# Patient Record
Sex: Male | Born: 1995
Health system: Southern US, Community
[De-identification: ages and names within clinical notes are randomized; demographics above are authoritative.]

---
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
## 2016-02-26 ENCOUNTER — Ambulatory Visit (INDEPENDENT_AMBULATORY_CARE_PROVIDER_SITE_OTHER): Payer: BLUE CROSS/BLUE SHIELD | Admitting: Family Medicine

## 2016-02-26 ENCOUNTER — Encounter: Payer: Self-pay | Admitting: Family Medicine

## 2016-02-26 VITALS — BP 123/64 | HR 56 | Temp 96.8°F

## 2016-02-26 DIAGNOSIS — L089 Local infection of the skin and subcutaneous tissue, unspecified: Secondary | ICD-10-CM

## 2016-02-26 MED ORDER — SULFAMETHOXAZOLE-TRIMETHOPRIM 800-160 MG PO TABS: 1.0000 | ORAL_TABLET | Freq: Two times a day (BID) | ORAL | 0 refills | Status: DC

## 2016-02-26 NOTE — Progress Notes (Signed)
Patient presents today with symptoms of slightly itchy rash to upper extremities and face. Patient states that initially the rash on his left forearm started by a scratch/scrape playing football. It has now started to drain some. He then noticed another lesion pop up on his right upper extremity and a few pimples on his face and beard area. He admits to having staph but not MRSA in high school. He denies any fever or chills or any other rash. He denies being in the whirlpool in the training room.  ROS: Negative except mentioned above. Vitals as per Epic.  GENERAL: NAD HEENT: no pharyngeal erythema, no exudate RESP: CTA B CARD: RRR SKIN: two slightly scabbed abrasions on the left forearm area, smaller area noted on the right forearm, no significant drainage noted of the areas, no streaks, a few pimples noted on the left side of his beard and on his forehead, no drainage noted of the sites NEURO: CN II-XII grossly intact   A/P: Skin infection -discussed with patient that I would recommend that he use a antibacterial soap to wash daily his face and body, pat dry, avoid shaving over the pimples on his face, should change out razor regularly, cover areas if draining, no weight room activities if the areas are draining, discussed not going in the whirlpool until areas are all healed, follow up with me at the end of the week if symptoms are worsening. Will discuss with trainer.

## 2017-07-27 ENCOUNTER — Encounter: Payer: Self-pay | Admitting: Family Medicine

## 2017-07-27 ENCOUNTER — Ambulatory Visit (INDEPENDENT_AMBULATORY_CARE_PROVIDER_SITE_OTHER): Payer: BLUE CROSS/BLUE SHIELD | Admitting: Family Medicine

## 2017-07-27 ENCOUNTER — Ambulatory Visit
Admission: RE | Admit: 2017-07-27 | Discharge: 2017-07-27 | Disposition: A | Discharge status: Home / Self Care | Payer: BLUE CROSS/BLUE SHIELD | Source: Ambulatory Visit | Attending: Family Medicine | Admitting: Family Medicine

## 2017-07-27 VITALS — BP 122/68 | HR 69 | Temp 97.2°F

## 2017-07-27 DIAGNOSIS — R05 Cough: Secondary | ICD-10-CM

## 2017-07-27 DIAGNOSIS — J45901 Unspecified asthma with (acute) exacerbation: Secondary | ICD-10-CM

## 2017-07-27 DIAGNOSIS — R059 Cough, unspecified: Secondary | ICD-10-CM

## 2017-07-27 LAB — POCT INFLUENZA A/B
INFLUENZA B, POC: NEGATIVE
Influenza A, POC: NEGATIVE

## 2017-07-27 MED ORDER — ALBUTEROL SULFATE HFA 108 (90 BASE) MCG/ACT IN AERS: 2.0000 | INHALATION_SPRAY | Freq: Four times a day (QID) | RESPIRATORY_TRACT | 2 refills | Status: AC | PRN

## 2017-07-27 MED ORDER — AZITHROMYCIN 250 MG PO TABS: ORAL_TABLET | ORAL | 0 refills | Status: DC

## 2017-07-27 NOTE — Progress Notes (Signed)
Patient presents today with symptoms of nasal congestion, night sweats, cough. He denies any documented fever. Patient states that he has had the symptoms for the last 4-5 days. He denies any body aches, headache, nausea, vomiting, diarrhea, abdominal pain. Patient admits to having a history of asthma as a child. He has been noticing more wheezing as he does physical activity or talks. He admits to yellow colored mucus. He denies any chest pain. He denies taking any medication today.  ROS: Negative except mentioned above. Vitals as per Epic. GENERAL: NAD HEENT: no pharyngeal erythema, no exudate, no erythema of TMs, no cervical LAD RESP: Bilateral expiratory wheezing, no accessory muscle use noted CARD: RRR NEURO: CN II-XII grossly intact   Rapid strep test: negative Influenza screen: negative  A/P: Bronchitis - will do chest x-ray to make sure patient doesn't have pneumonia, Z-Pak prescribed, Albuterol Inhaler when necessary, Delsym when necessary, Tylenol/Ibuprofen when necessary, rest, hydration, no athletic activity today, should not go to class or do athletic activity if febrile, if breathing symptoms worsen should seek immediate medical attention, trainer will reassess status in the morning to see if patient should do any athletic activity based off of above.

## 2017-11-16 ENCOUNTER — Ambulatory Visit (INDEPENDENT_AMBULATORY_CARE_PROVIDER_SITE_OTHER): Payer: BLUE CROSS/BLUE SHIELD | Admitting: Family Medicine

## 2017-11-16 ENCOUNTER — Encounter: Payer: Self-pay | Admitting: Family Medicine

## 2017-11-16 DIAGNOSIS — M25519 Pain in unspecified shoulder: Secondary | ICD-10-CM

## 2017-11-16 NOTE — Progress Notes (Signed)
Patient presents today with symptoms of right shoulder pain. He states that he was playing Ultimate Frisbee with his teammates when he landed on his right shoulder. He denies any feeling of subluxation. He states that the athletic trainer believes he has an AC sprain. He states that his motion was more limited a few days ago but has improved. He denies any history of any shoulder problems in the past. He has been wearing a sling intermittently for comfort. He has also been taking ibuprofen on occasion.  ROS: Negative except mentioned above. Vitals as per Epic. GENERAL: NAD MSK: R Shoulder : no ecchymosis or significant swelling appreciated in the right shoulder area, there is mild tenderness in the mid clavicle area and AC Joint, there is some mild tenderness in the lateral pectoralis muscle area, no obvious defect noted in this area or ecchymosis, mild pain with cross arm testing and forward flexion above 100 degrees, negative impingement tests, negative apprehension, mild discomfort with lift off, negative empty can, NV intact NEURO: CN II-XII grossly intact   A/P: Right Shoulder Injury - discussed likelihood of AC sprain, will get x-rays, sling for comfort, NSAIDs when necessary, ice area, no athletic activity with the right upper extremity, can do lower extremity work if tolerated, will discuss plan above with trainer, follow-up with Dr. Ardine Engiehl the next he is on campus. Seek medical attention if any worsening of symptoms.

## 2017-11-20 ENCOUNTER — Ambulatory Visit
Admission: RE | Admit: 2017-11-20 | Discharge: 2017-11-20 | Disposition: A | Discharge status: Home / Self Care | Payer: BLUE CROSS/BLUE SHIELD | Source: Ambulatory Visit | Attending: Family Medicine | Admitting: Family Medicine

## 2017-11-20 DIAGNOSIS — S4991XA Unspecified injury of right shoulder and upper arm, initial encounter: Secondary | ICD-10-CM | POA: Diagnosis not present

## 2017-11-20 DIAGNOSIS — M25511 Pain in right shoulder: Secondary | ICD-10-CM | POA: Insufficient documentation

## 2017-11-20 DIAGNOSIS — M25519 Pain in unspecified shoulder: Secondary | ICD-10-CM

## 2017-11-30 ENCOUNTER — Encounter: Payer: Self-pay | Admitting: Family Medicine

## 2017-11-30 ENCOUNTER — Ambulatory Visit (INDEPENDENT_AMBULATORY_CARE_PROVIDER_SITE_OTHER): Payer: BLUE CROSS/BLUE SHIELD | Admitting: Family Medicine

## 2017-11-30 VITALS — Temp 98.6°F

## 2017-11-30 DIAGNOSIS — L0202 Furuncle of face: Secondary | ICD-10-CM | POA: Diagnosis not present

## 2017-11-30 MED ORDER — CHLORHEXIDINE GLUCONATE 4 % EX LIQD: Freq: Every day | CUTANEOUS | 0 refills | Status: AC | PRN

## 2017-11-30 MED ORDER — SULFAMETHOXAZOLE-TRIMETHOPRIM 800-160 MG PO TABS: 1.0000 | ORAL_TABLET | Freq: Two times a day (BID) | ORAL | 0 refills | Status: DC

## 2017-11-30 NOTE — Progress Notes (Signed)
Patient presents today with history of draining lesion on right side of face. Patient states that initially he noticed a bump along his beard line on the right side a few days ago. He tried to pop the area with his fingers initially and then with a needle. The area has been draining and is less swollen since doing this. It is however tender. He denies any fever or chills. He states that occasionally he gets these on his face and scalp. Patient denies shaving his facial hair.   ROS: Negative except mentioned above. Vitals as per Epic. GENERAL: NAD SKIN: quarter sized mildly tender, indurated area along the right jaw line where patient's beard is, some white discharge expressed, no other lesions noted on the face or scalp, the area is not extending to the neck or towards the ear or nose NEURO: CN II-XII grossly intact   A/P: Furuncle Face - warm compresses to the area, culture taken of the area, recommend no athletic activity for now over the next 2 days as the area is draining, will reevaluate patient on Wednesday or Thursday, Bactrim DS for 10 days, Hibiclens Solution to wash the area, Tylenol/Motrin when necessary for pain. Discussed above with trainer as well. If the area persists or worsens, will consider referral to Dermatology.

## 2017-12-01 ENCOUNTER — Ambulatory Visit (INDEPENDENT_AMBULATORY_CARE_PROVIDER_SITE_OTHER): Payer: BLUE CROSS/BLUE SHIELD | Admitting: Family Medicine

## 2017-12-01 VITALS — Temp 97.7°F

## 2017-12-01 DIAGNOSIS — L0202 Furuncle of face: Secondary | ICD-10-CM

## 2017-12-01 DIAGNOSIS — L089 Local infection of the skin and subcutaneous tissue, unspecified: Secondary | ICD-10-CM

## 2017-12-01 NOTE — Progress Notes (Signed)
Patient presents today with symptoms of of right upper eyelid swelling. Patient states that he notices this morning. Patient was treated for an abscess on his face yesterday with Bactrim DS. The patient states that the area is less tender and is still draining some. He denies any tenderness or swelling of his right cheek. He denies any discharge from the eyes. He denies any fever, chills, or headache. He did not take any Tylenol or Ibuprofen today. He has taken only 2 doses of his Bactrim DS. Results for wound culture are not back.  ROS: Negative except mentioned above. Vitals as per Epic. GENERAL: NAD HEENT: no erythema of the conjunctiva, no discharge from the eye, minimal swelling of the right upper eyelid, PERRL, EOMI SKIN: there is still a quarter sized indurated area along the right jaw line, minimal fluid is expressed, the area is less tender than yesterday, no other swelling noted around the orbit or other parts of the face besides right upper eyelid, no rash anywhere else on body  NEURO: CN II-XII grossly intact   A/P: Right Facial Abscess - there is minimal swelling of the right upper eyelid,  Consulted with Dr. Pilar Plateavid Dasher (Dermatology) who suggested increasing his Bactrim DS to 2 tabs twice daily. Dr. Adolphus Birchwoodasher will see the patient tomorrow. We will await wound culture results. Continue warm compresses on the area. Patient given gloves and bandages. Encourage patient to wash hands frequently. No athletic activity for now. If any acute worsening symptoms patient is to seek immediate medical attention. If symptoms do not improve will have to consider IV/IM antibiotics, incision and drainage of the area, etc. after seeing and consulting with Dermatology. Discussed above with trainer.

## 2017-12-02 ENCOUNTER — Other Ambulatory Visit: Payer: Self-pay | Admitting: Family Medicine

## 2017-12-02 DIAGNOSIS — L0201 Cutaneous abscess of face: Secondary | ICD-10-CM | POA: Diagnosis not present

## 2017-12-02 MED ORDER — CEPHALEXIN 500 MG PO CAPS: 500.0000 mg | ORAL_CAPSULE | Freq: Four times a day (QID) | ORAL | 0 refills | Status: DC

## 2017-12-03 LAB — WOUND CULTURE: Organism ID, Bacteria: NONE SEEN

## 2018-01-21 ENCOUNTER — Ambulatory Visit
Admission: RE | Admit: 2018-01-21 | Discharge: 2018-01-21 | Disposition: A | Discharge status: Home / Self Care | Payer: BLUE CROSS/BLUE SHIELD | Source: Ambulatory Visit | Attending: Family Medicine | Admitting: Family Medicine

## 2018-01-21 ENCOUNTER — Encounter: Payer: Self-pay | Admitting: Family Medicine

## 2018-01-21 ENCOUNTER — Ambulatory Visit (INDEPENDENT_AMBULATORY_CARE_PROVIDER_SITE_OTHER): Payer: BLUE CROSS/BLUE SHIELD | Admitting: Family Medicine

## 2018-01-21 VITALS — BP 116/68 | HR 69 | Temp 98.1°F | Resp 14

## 2018-01-21 DIAGNOSIS — R062 Wheezing: Secondary | ICD-10-CM

## 2018-01-21 DIAGNOSIS — R0602 Shortness of breath: Secondary | ICD-10-CM | POA: Diagnosis not present

## 2018-01-21 MED ORDER — FLUTICASONE PROPIONATE HFA 110 MCG/ACT IN AERO: 2.0000 | INHALATION_SPRAY | Freq: Two times a day (BID) | RESPIRATORY_TRACT | 12 refills | Status: AC

## 2018-01-21 MED ORDER — PREDNISONE 20 MG PO TABS: ORAL_TABLET | ORAL | 0 refills | Status: DC

## 2018-01-21 MED ORDER — AZITHROMYCIN 250 MG PO TABS: ORAL_TABLET | ORAL | 0 refills | Status: DC

## 2018-02-05 NOTE — Progress Notes (Signed)
Patient presents today with symptoms of wheezing and coughing for the last few days. Patient has history of seasonal allergies and asthma. Denies any fever, CP, N/V/D, abdominal pain, severe headache. Has been using his Albuterol Inhaler 2-3x daily since symptoms started. Was seen at [facility] yesterday and given a neb treatment which did help his symptoms. He has had to get up during the night to use his Albuterol Inhaler once. Denies any hospitalizations related to asthma previously. Unsure of any particular allergen he has been around recently. Has restarted his oral antihistamine daily. Denies illicit drug use, smoking, or alcohol use. Did not practice yesterday.   ROS: Negative except mentioned above. Vitals as per Epic.  GENERAL: NAD HEENT: no pharyngeal erythema, no exudate, no erythema of TMs, no cervical LAD RESP: expiratory wheezing, no accessory muscle use CARD: RRR NEURO: CN II-XII grossly intact   A/P: Asthma Exacerbation - patient has restarted his oral antihistamine in case allergen is contributing to symptoms, will get CXR, will start short course of oral Prednisone, Flovent, and Z-pk, continue Albuterol Inhaler every 4-6 hrs as needed, trainer will check peak flow daily and follow Asthma Action Plan, will do neb treatment in the field house if needed, if symptoms worsen patient is to call 911 or go to the ER, patient addresses understanding of paln, no athletic activity today.

## 2020-03-02 IMAGING — CR DG CLAVICLE*R*
1 series · 2 of 2 positions shown · non-contrast
Comparison: None.

CLINICAL DATA: PT STATES FALL ONTO RT SHOULDER LAST WEEK AND HAS
SINCE HAD PAIN TO RT CLAVICLE / AC JOINT AREA

EXAM:
RIGHT CLAVICLE - 2+ VIEWS

[Series 1: dg clavicle right · 0.14mm/px · 2 of 2 slices shown]
[im 1/2]
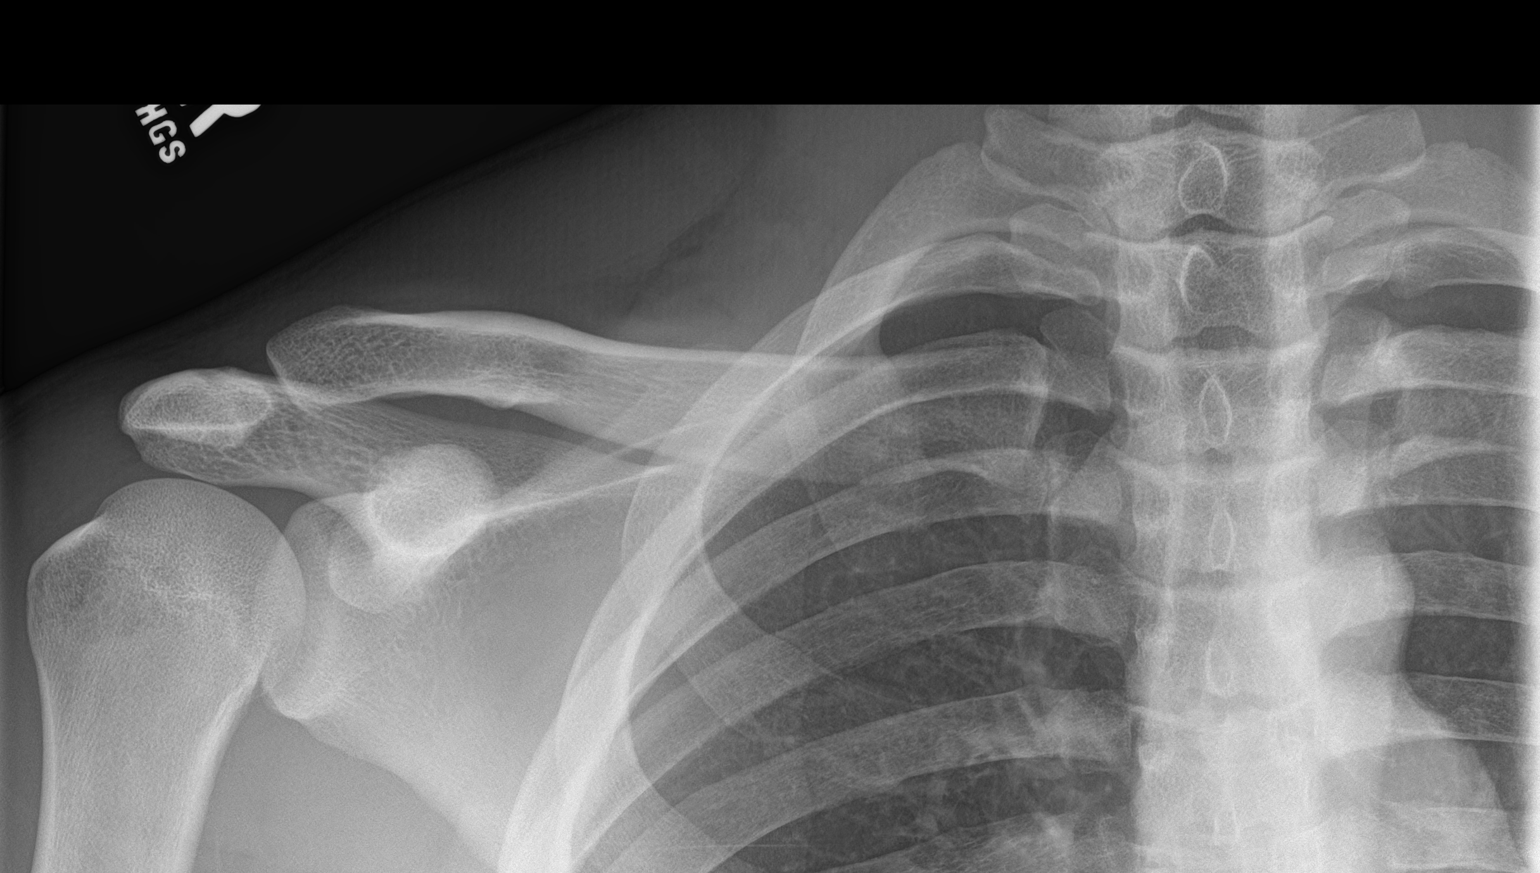
[im 2/2]
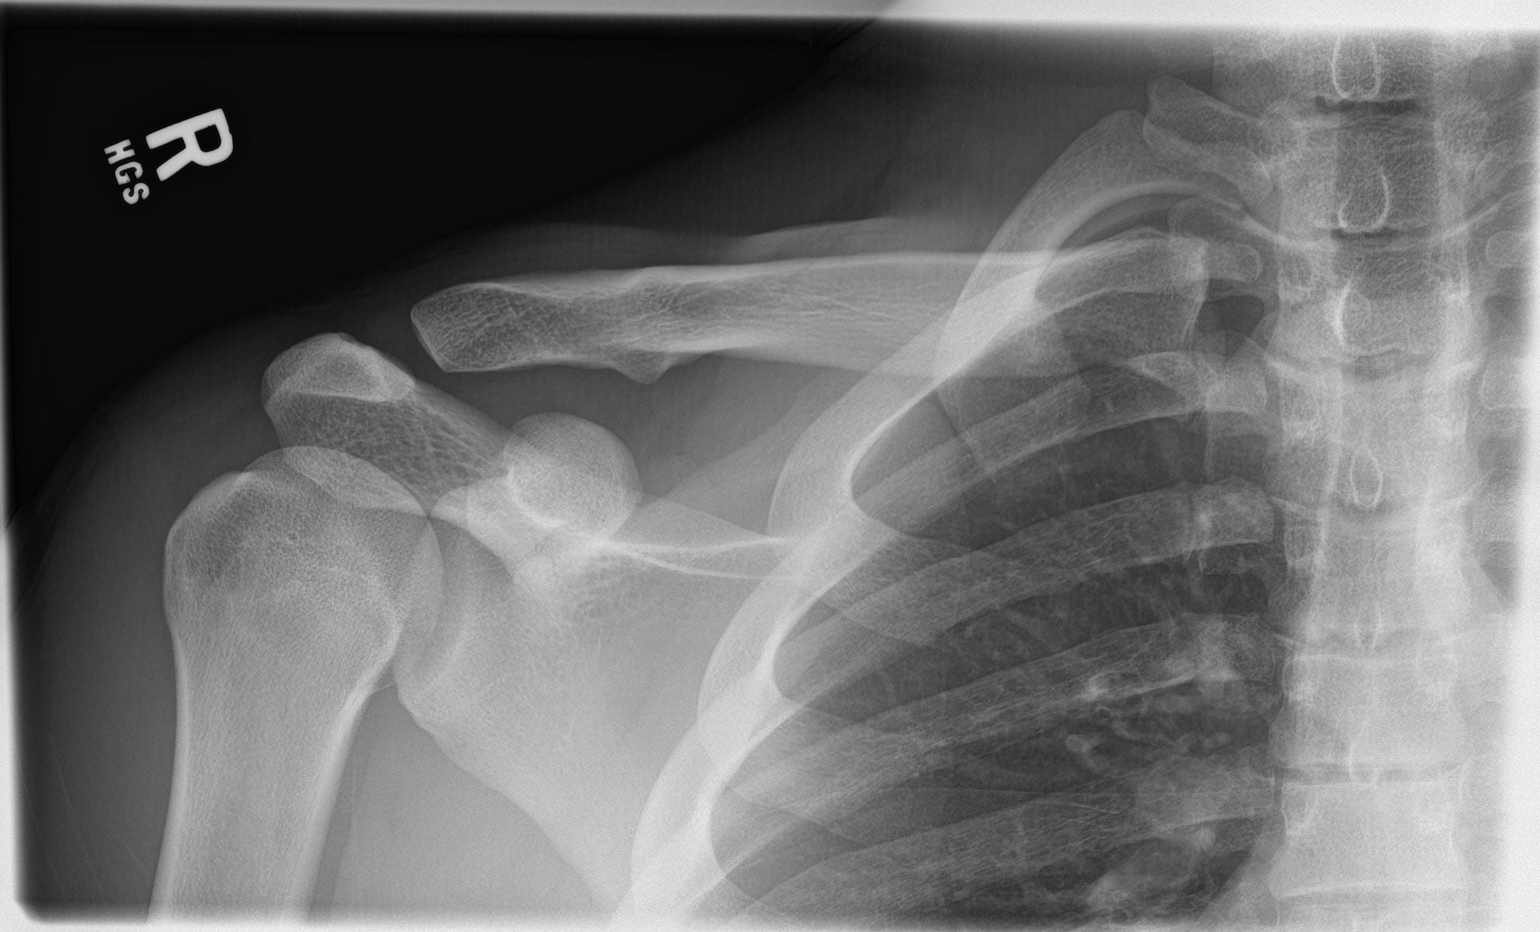

[2 of 2 positions shown; findings below may reference images not displayed]

FINDINGS: There is no evidence of fracture or other focal bone lesions.
Acromioclavicular space is at the upper limits normal in width. Soft
tissues are unremarkable.
IMPRESSION: Negative.

## 2020-03-02 IMAGING — CR DG AC JOINTS*L*
1 series · 4 of 4 positions shown · non-contrast
Comparison: Chest 07/27/2017

CLINICAL DATA: PT STATES FALL ONTO RT SHOULDER LAST WEEK AND HAS
SINCE HAD PAIN TO RT CLAVICLE / AC JOINT AREA

EXAM:
BILATERAL ACROMIOCLAVICULAR JOINTS

[Series 1: dg ac joints · 0.14mm/px · 4 of 4 slices shown]
[im 1/4]
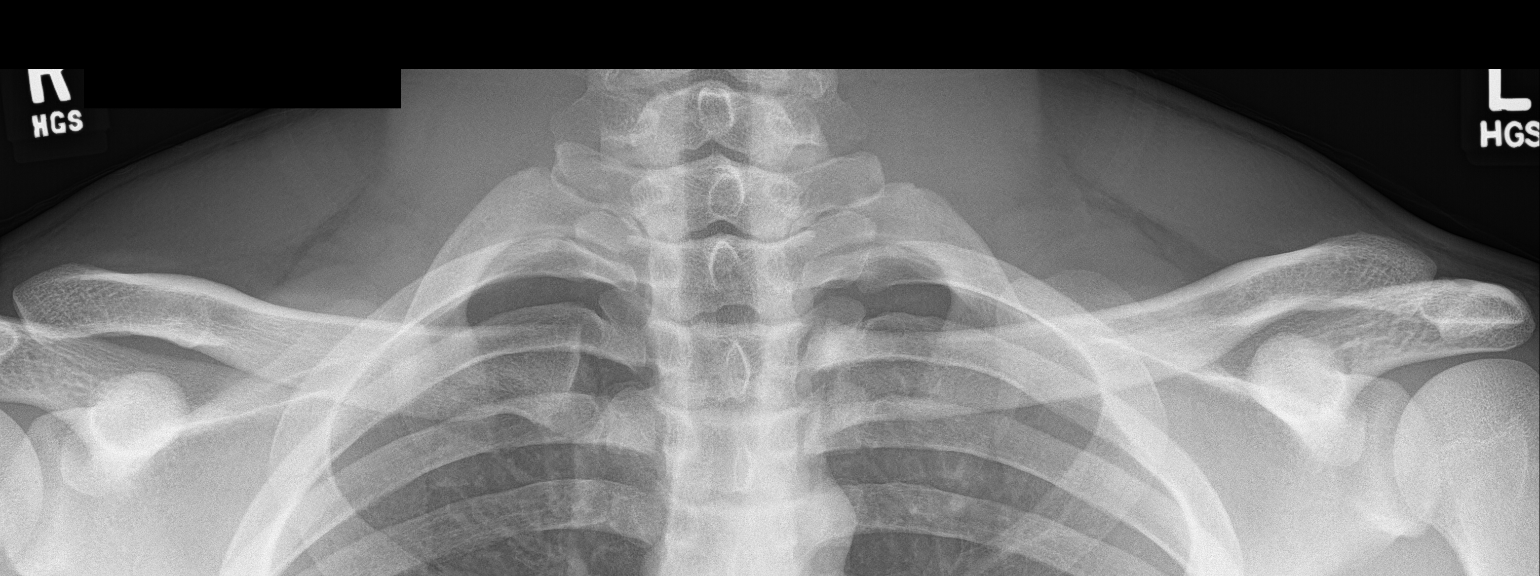
[im 2/4]
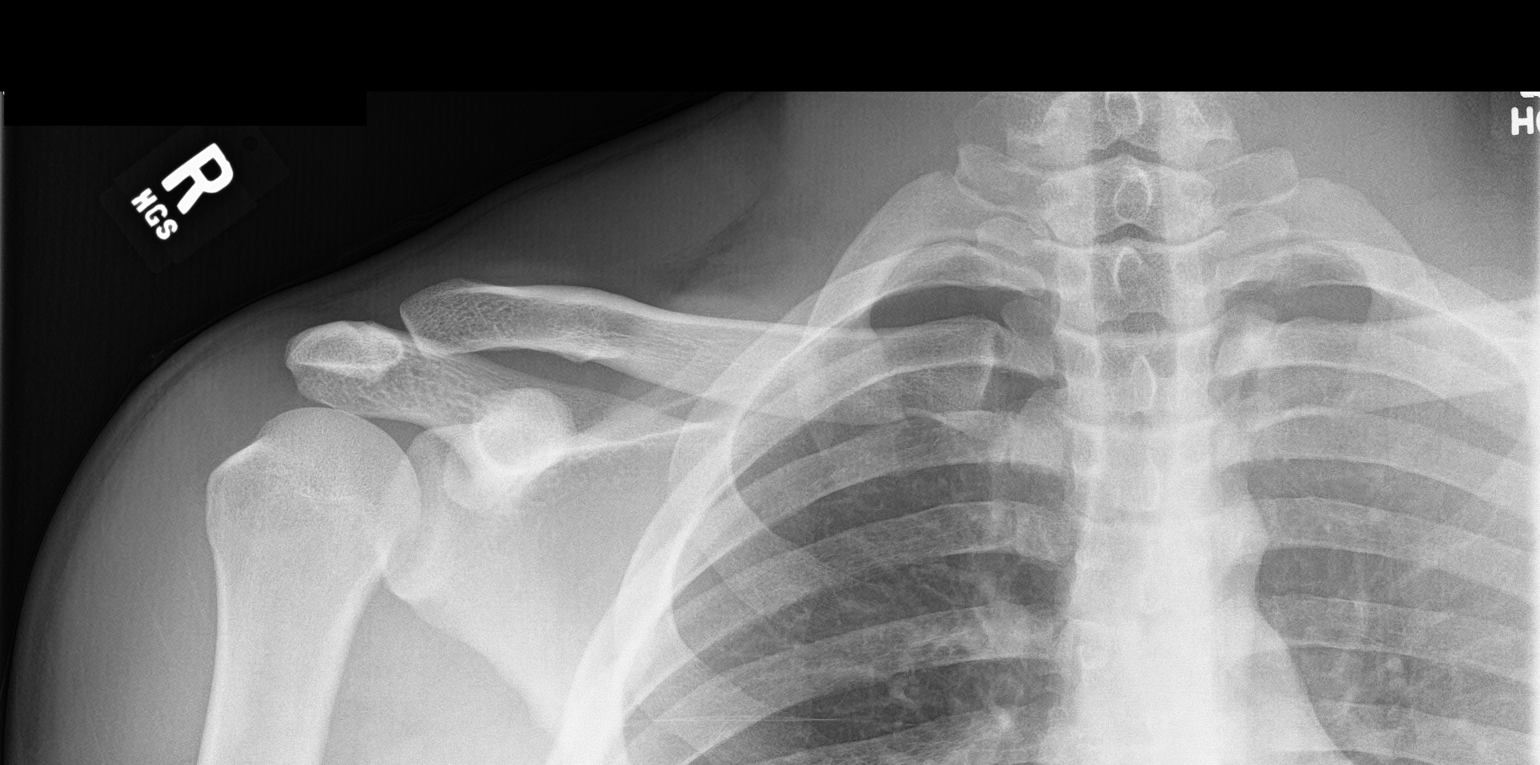
[im 3/4]
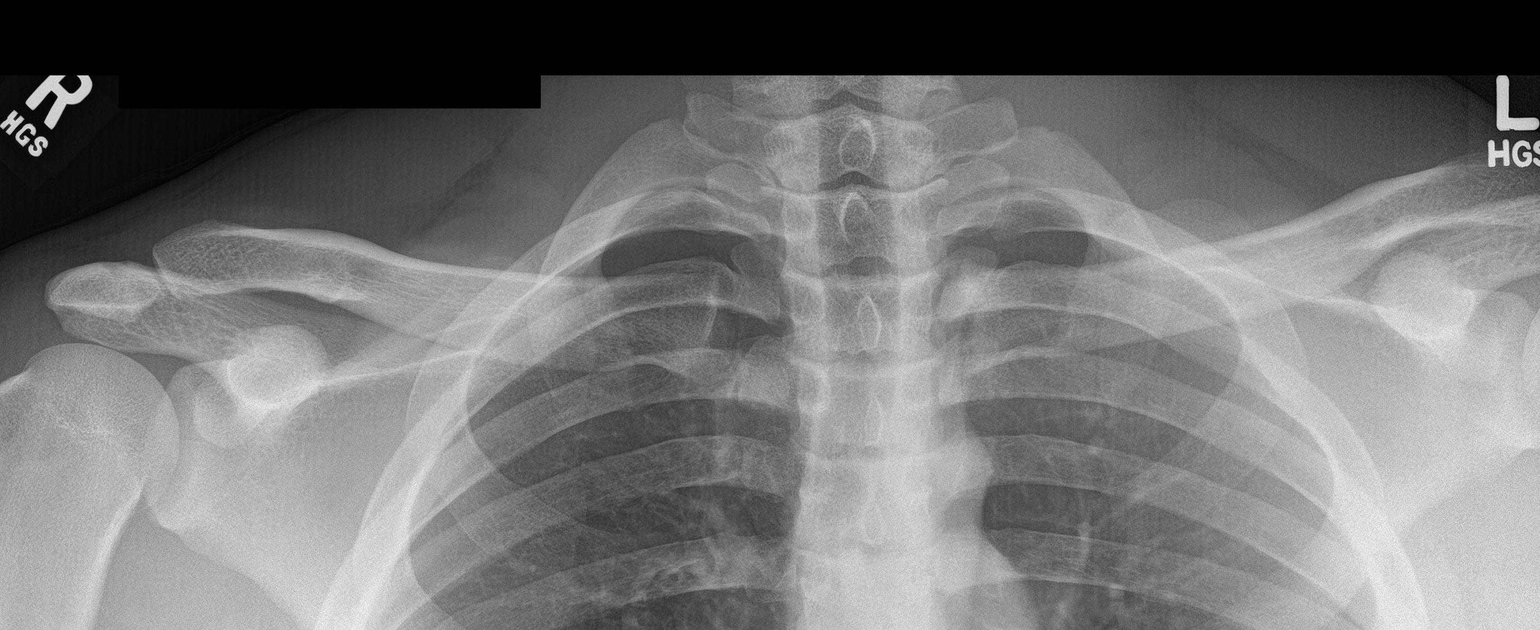
[im 4/4]
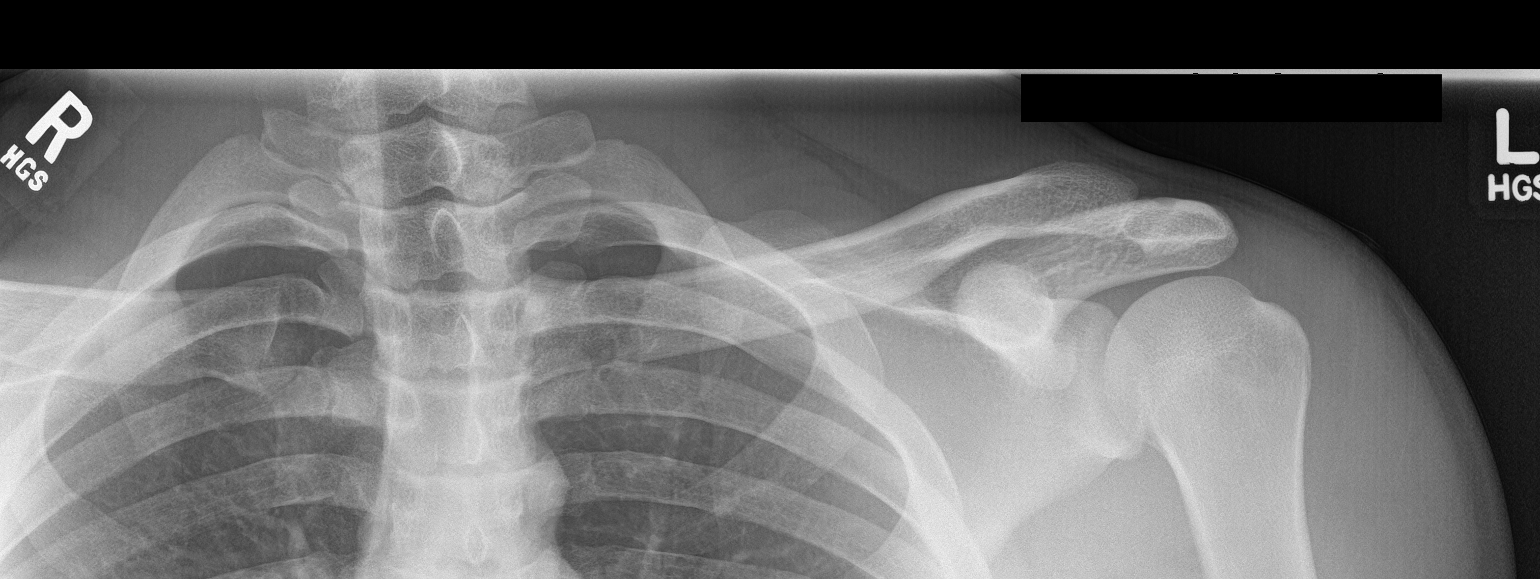

[4 of 4 positions shown; findings below may reference images not displayed]

FINDINGS: Negative for fracture or dislocation. Normal mineralization and
alignment. No significant osseous degenerative change. No abnormal
widening of the acromioclavicular space on weight-bearing images.
The coracoclavicular relationships remain preserved.
IMPRESSION: Negative

## 2020-05-03 IMAGING — CR DG CHEST 2V
1 series · 2 of 2 positions shown · non-contrast
Comparison: 07/27/2017

CLINICAL DATA: Shortness of breath and wheezing

EXAM:
CHEST - 2 VIEW

[Series 1: dg chest 2 view · 0.14mm/px · 2 of 2 slices shown]
[im 1/2]
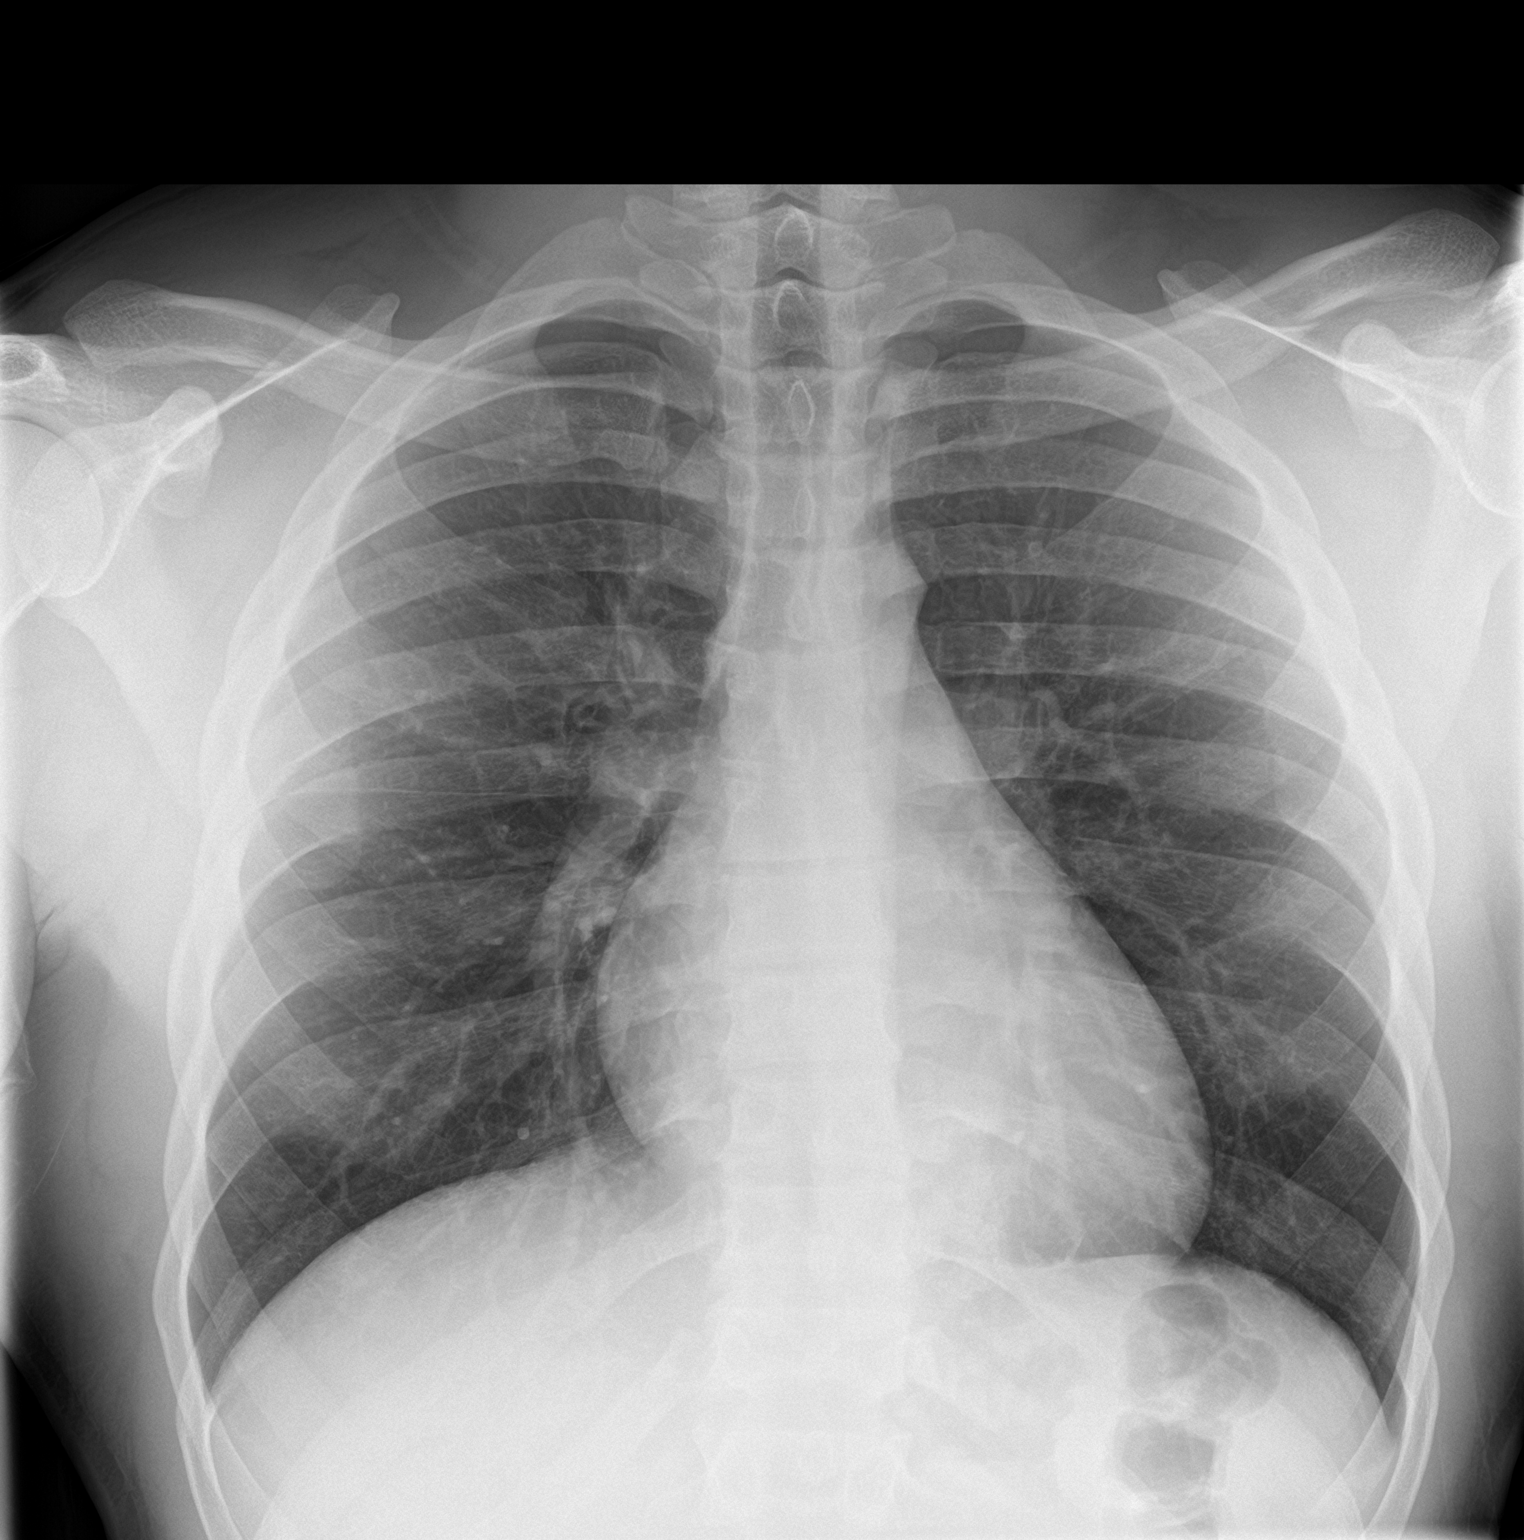
[im 2/2]
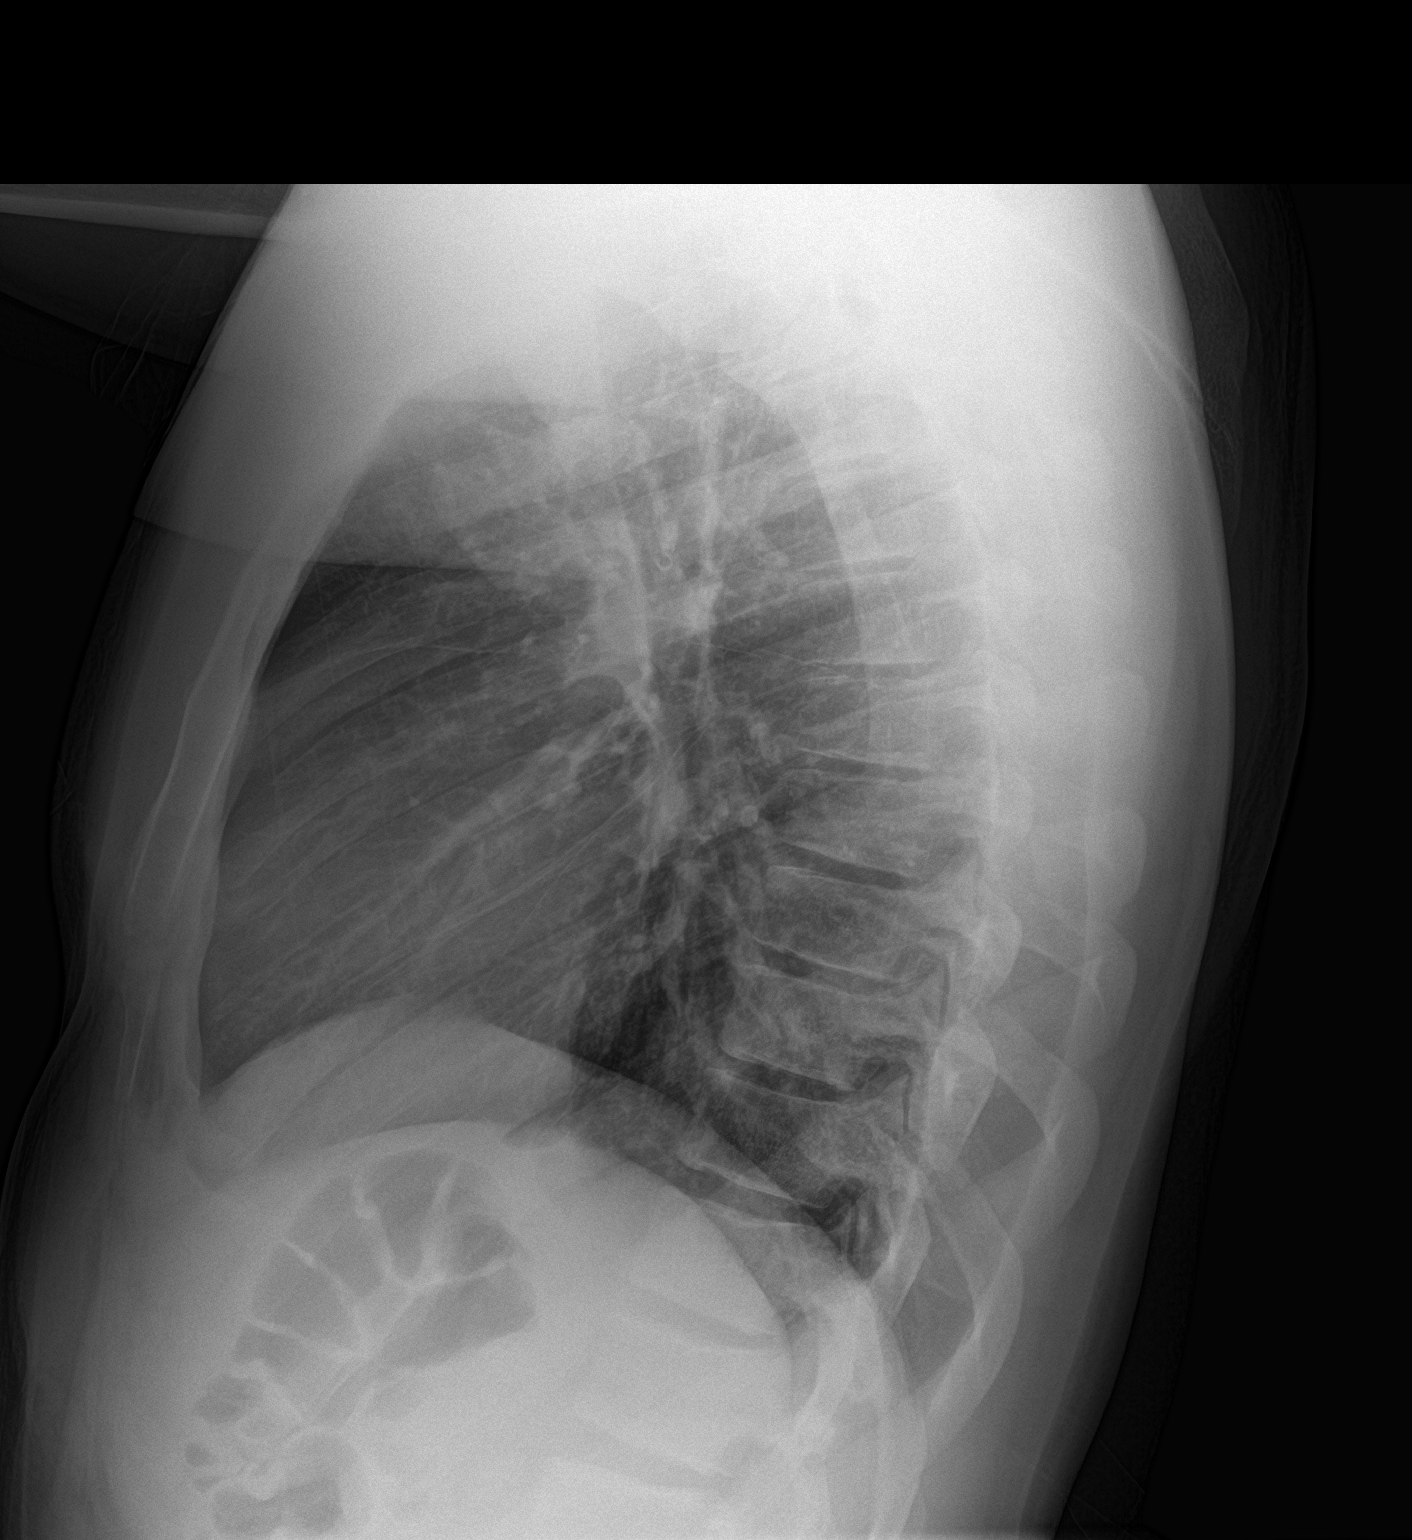

[2 of 2 positions shown; findings below may reference images not displayed]

FINDINGS: The heart size and mediastinal contours are within normal limits.
Both lungs are clear. The visualized skeletal structures are
unremarkable.
IMPRESSION: No active cardiopulmonary disease.

## 2024-05-30 ENCOUNTER — Ambulatory Visit
Admission: EM | Admit: 2024-05-30 | Discharge: 2024-05-30 | Disposition: A | Discharge status: Home / Self Care | Attending: Physician Assistant | Admitting: Physician Assistant

## 2024-05-30 DIAGNOSIS — J040 Acute laryngitis: Secondary | ICD-10-CM

## 2024-05-30 DIAGNOSIS — R051 Acute cough: Secondary | ICD-10-CM | POA: Diagnosis not present

## 2024-05-30 DIAGNOSIS — J209 Acute bronchitis, unspecified: Secondary | ICD-10-CM

## 2024-05-30 DIAGNOSIS — J029 Acute pharyngitis, unspecified: Secondary | ICD-10-CM

## 2024-05-30 LAB — POCT RAPID STREP A (OFFICE): Rapid Strep A Screen: NEGATIVE

## 2024-05-30 MED ORDER — AMOXICILLIN-POT CLAVULANATE 875-125 MG PO TABS: 1.0000 | ORAL_TABLET | Freq: Two times a day (BID) | ORAL | 0 refills | Status: AC

## 2024-05-30 MED ORDER — PROMETHAZINE-DM 6.25-15 MG/5ML PO SYRP: 5.0000 mL | ORAL_SOLUTION | Freq: Four times a day (QID) | ORAL | 0 refills | Status: AC | PRN

## 2024-05-30 MED ORDER — PREDNISONE 20 MG PO TABS: 40.0000 mg | ORAL_TABLET | Freq: Every day | ORAL | 0 refills | Status: AC

## 2024-05-30 NOTE — ED Triage Notes (Signed)
 Pt present sore throat with loss voices, symptom started 11 days ago when he lost his voices. Today pt woke up to his throat hurts to swallow.

## 2024-05-30 NOTE — Discharge Instructions (Addendum)
-   Strep test was negative. - You have bronchitis and laryngitis.  This is usually viral and can last a few weeks.  However, we can treat with a antibiotic in case you are developing a bacterial infection.  I also sent prednisone  to help with inflammation and swelling and cough medication. - If you develop a fever or worsening symptoms please return.

## 2024-05-30 NOTE — ED Provider Notes (Signed)
 " [facility]    CSN: [id number] Arrival date & time: 05/30/24  1020      History   Chief Complaint Chief Complaint  Patient presents with   Sore Throat    HPI Donald Price is a 29 y.o. male presenting for approximately 2-week history of sore throat, voice hoarseness, cough, congestion and fatigue.  Patient reports increased sore throat and worsening fatigue today.  Denies fever.  Has not had any chest pain, wheezing or shortness of breath.  Has been taking OTC meds without improvement.  HPI  History reviewed. No pertinent past medical history.  There are no active problems to display for this patient.   History reviewed. No pertinent surgical history.     Home Medications    Prior to Admission medications  Medication Sig Start Date End Date Taking? Authorizing Provider  amoxicillin -clavulanate (AUGMENTIN ) 875-125 MG tablet Take 1 tablet by mouth every 12 (twelve) hours for 7 days. 05/30/24 06/06/24 Yes Arvis Jolan NOVAK, PA-C  predniSONE  (DELTASONE ) 20 MG tablet Take 2 tablets (40 mg total) by mouth daily for 5 days. 05/30/24 06/04/24 Yes Arvis Jolan B, PA-C  promethazine -dextromethorphan (PROMETHAZINE -DM) 6.25-15 MG/5ML syrup Take 5 mLs by mouth 4 (four) times daily as needed. 05/30/24  Yes Arvis Jolan NOVAK, PA-C  albuterol  (PROVENTIL  HFA;VENTOLIN  HFA) 108 (90 Base) MCG/ACT inhaler Inhale 2 puffs into the lungs every 6 (six) hours as needed for wheezing or shortness of breath. Patient not taking: Reported on 11/16/2017 07/27/17   Tobie Ester, MD  chlorhexidine  (HIBICLENS ) 4 % external liquid Apply topically daily as needed. And rinse off. 11/30/17   Tobie Ester, MD  fluticasone  (FLOVENT  HFA) 110 MCG/ACT inhaler Inhale 2 puffs into the lungs 2 (two) times daily. Use for 10 days. 01/21/18   Tobie Ester, MD    Family History Family History  Problem Relation Age of Onset   Diabetes Maternal Grandfather     Social History Social History[1]   Allergies    Patient has no known allergies.   Review of Systems Review of Systems  Constitutional:  Positive for fatigue. Negative for fever.  HENT:  Positive for congestion, rhinorrhea, sore throat, trouble swallowing and voice change. Negative for sinus pressure and sinus pain.   Respiratory:  Positive for cough. Negative for shortness of breath and wheezing.   Cardiovascular:  Negative for chest pain.  Gastrointestinal:  Negative for abdominal pain, diarrhea, nausea and vomiting.  Musculoskeletal:  Negative for myalgias.  Neurological:  Negative for weakness, light-headedness and headaches.  Hematological:  Negative for adenopathy.     Physical Exam Triage Vital Signs ED Triage Vitals  Encounter Vitals Group     BP      Girls Systolic BP Percentile      Girls Diastolic BP Percentile      Boys Systolic BP Percentile      Boys Diastolic BP Percentile      Pulse      Resp      Temp      Temp src      SpO2      Weight      Height      Head Circumference      Peak Flow      Pain Score      Pain Loc      Pain Education      Exclude from Growth Chart    No data found.  Updated Vital Signs BP 126/85 (BP Location: Left Arm)  Pulse 66   Temp 98.4 F (36.9 C) (Oral)   Resp 16   Wt (!) 308 lb (139.7 kg)   SpO2 96%      Physical Exam Vitals and nursing note reviewed.  Constitutional:      General: He is not in acute distress.    Appearance: Normal appearance. He is well-developed. He is ill-appearing.     Comments: Voice is hoarse  HENT:     Head: Normocephalic and atraumatic.     Right Ear: Tympanic membrane, ear canal and external ear normal.     Left Ear: Tympanic membrane, ear canal and external ear normal.     Nose: Congestion present.     Mouth/Throat:     Mouth: Mucous membranes are moist.     Pharynx: Oropharynx is clear. Posterior oropharyngeal erythema present.  Eyes:     General: No scleral icterus.    Conjunctiva/sclera: Conjunctivae normal.   Cardiovascular:     Rate and Rhythm: Normal rate and regular rhythm.  Pulmonary:     Effort: Pulmonary effort is normal. No respiratory distress.     Breath sounds: Normal breath sounds.  Musculoskeletal:     Cervical back: Neck supple.  Skin:    General: Skin is warm and dry.     Capillary Refill: Capillary refill takes less than 2 seconds.  Neurological:     General: No focal deficit present.     Mental Status: He is alert. Mental status is at baseline.     Motor: No weakness.     Gait: Gait normal.  Psychiatric:        Mood and Affect: Mood normal.        Behavior: Behavior normal.      UC Treatments / Results  Labs (all labs ordered are listed, but only abnormal results are displayed) Labs Reviewed  POCT RAPID STREP A (OFFICE) - Normal    EKG   Radiology No results found.  Procedures Procedures (including critical care time)  Medications Ordered in UC Medications - No data to display  Initial Impression / Assessment and Plan / UC Course  I have reviewed the triage vital signs and the nursing notes.  Pertinent labs & imaging results that were available during my care of the patient were reviewed by me and considered in my medical decision making (see chart for details).   29 year old male presents for 2-week history of cough, congestion, sore throat and loss of voice.  Worsening symptoms today.  Vitals are all stable and normal.  He is ill-appearing and voice is hoarse.  Nontoxic.  On exam has nasal congestion, erythema posterior pharynx.  Chest clear.  Rapid strep negative.  Acute bronchitis/laryngitis.  Supportive care encouraged with increasing rest and fluids.  Advised symptoms likely viral.  Patient would like to try an antibiotic to see if it will help.  Sent Augmentin  to pharmacy.  Also sent prednisone  and Promethazine  DM.  Reviewed return precautions.   Final Clinical Impressions(s) / UC Diagnoses   Final diagnoses:  Sore throat  Acute  bronchitis, unspecified organism  Acute cough  Acute laryngitis     Discharge Instructions      - Strep test was negative. - You have bronchitis and laryngitis.  This is usually viral and can last a few weeks.  However, we can treat with a antibiotic in case you are developing a bacterial infection.  I also sent prednisone  to help with inflammation and swelling and cough medication. - If you  develop a fever or worsening symptoms please return.     ED Prescriptions     Medication Sig Dispense Auth. Provider   predniSONE  (DELTASONE ) 20 MG tablet Take 2 tablets (40 mg total) by mouth daily for 5 days. 10 tablet Arvis Huxley B, PA-C   amoxicillin -clavulanate (AUGMENTIN ) 875-125 MG tablet Take 1 tablet by mouth every 12 (twelve) hours for 7 days. 14 tablet Arvis Huxley B, PA-C   promethazine -dextromethorphan (PROMETHAZINE -DM) 6.25-15 MG/5ML syrup Take 5 mLs by mouth 4 (four) times daily as needed. 118 mL Arvis Huxley NOVAK, PA-C      PDMP not reviewed this encounter.     [1]  Social History Tobacco Use   Smoking status: Never   Smokeless tobacco: Never  Substance Use Topics   Alcohol use: No     Arvis Huxley NOVAK, PA-C 05/30/24 1127  "
